# Patient Record
Sex: Female | Born: 1955 | Race: White | Hispanic: Yes | Marital: Married | Smoking: Never smoker
Health system: Southern US, Community
[De-identification: ages and names within clinical notes are randomized; demographics above are authoritative.]

---

## 2007-07-06 ENCOUNTER — Emergency Department (HOSPITAL_COMMUNITY): Admission: EM | Admit: 2007-07-06 | Discharge: 2007-07-06 | Payer: Self-pay | Admitting: Emergency Medicine

## 2007-08-08 ENCOUNTER — Ambulatory Visit (HOSPITAL_COMMUNITY): Admission: RE | Admit: 2007-08-08 | Discharge: 2007-08-08 | Payer: Self-pay | Admitting: *Deleted

## 2007-08-08 ENCOUNTER — Encounter (INDEPENDENT_AMBULATORY_CARE_PROVIDER_SITE_OTHER): Payer: Self-pay | Admitting: *Deleted

## 2007-12-21 IMAGING — US US ABDOMEN COMPLETE
1 series · 13 of 25 positions shown · non-contrast
Comparison: None.

CLINICAL DATA: 51-year-old female, right abdominal pain, vomiting.  
 RENAL/URINARY TRACT ULTRASOUND:
TECHNIQUE: Complete ultrasound examination of the urinary tract was performed including evaluation of the kidneys, renal collecting systems, and urinary bladder.

[Series 1: unknown · 0.38mm/px · 13 of 78 slices shown]
[im 1/78]
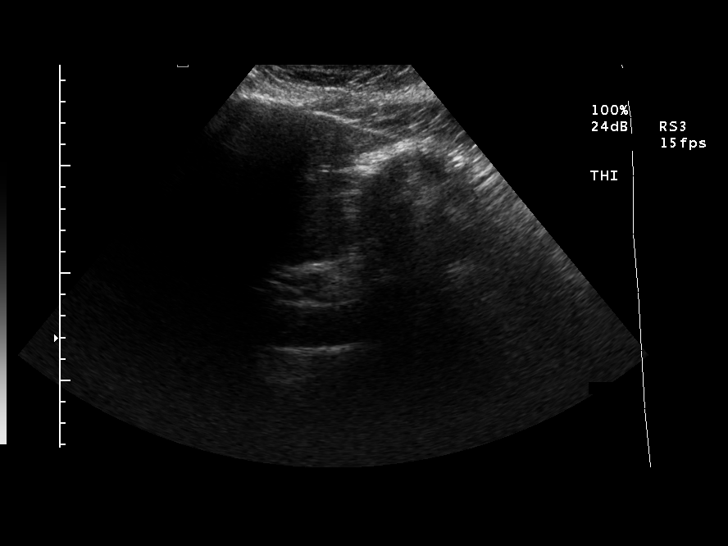
[im 7/78]
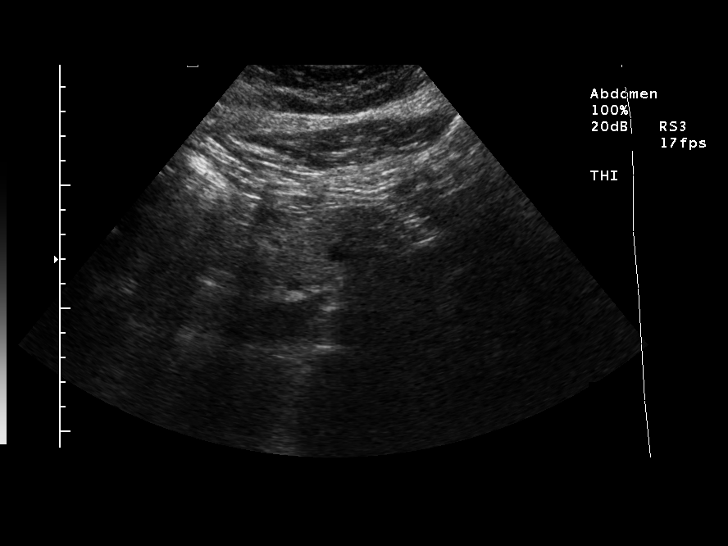
[im 13/78]
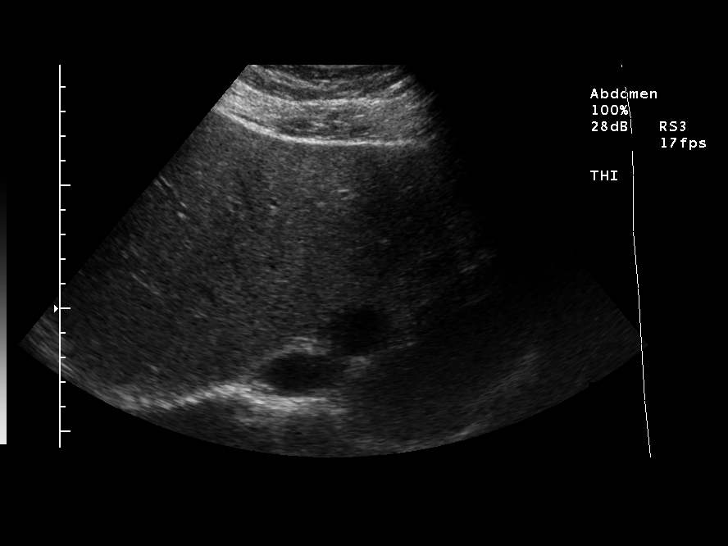
[im 20/78]
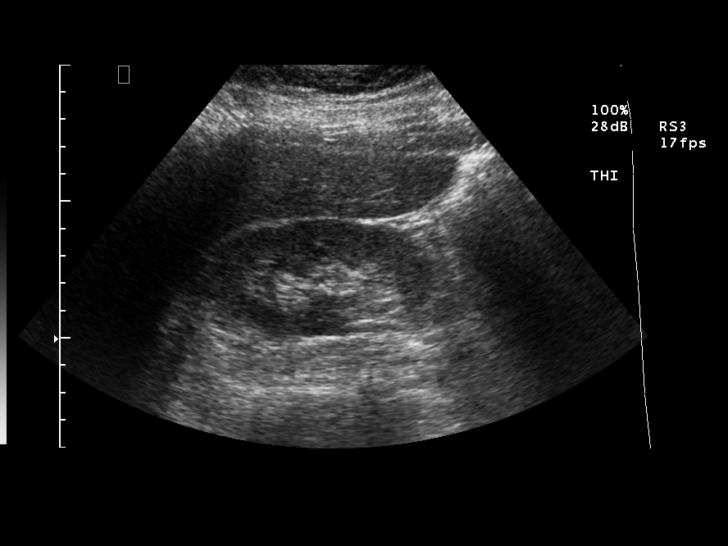
[im 26/78]
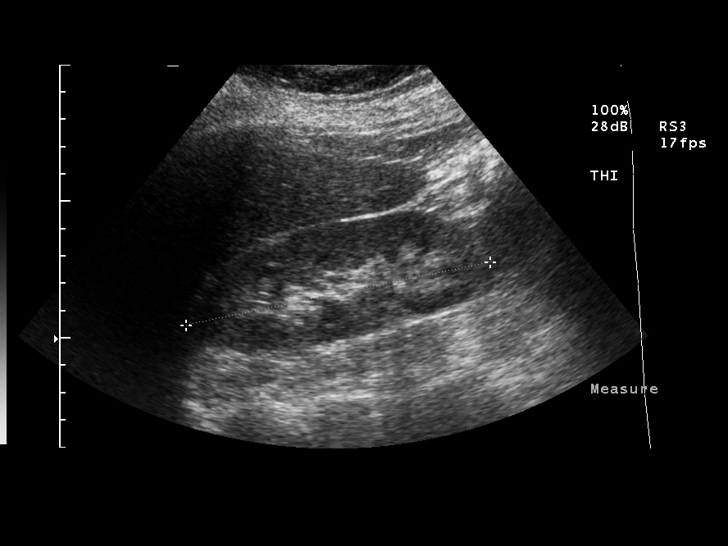
[im 33/78]
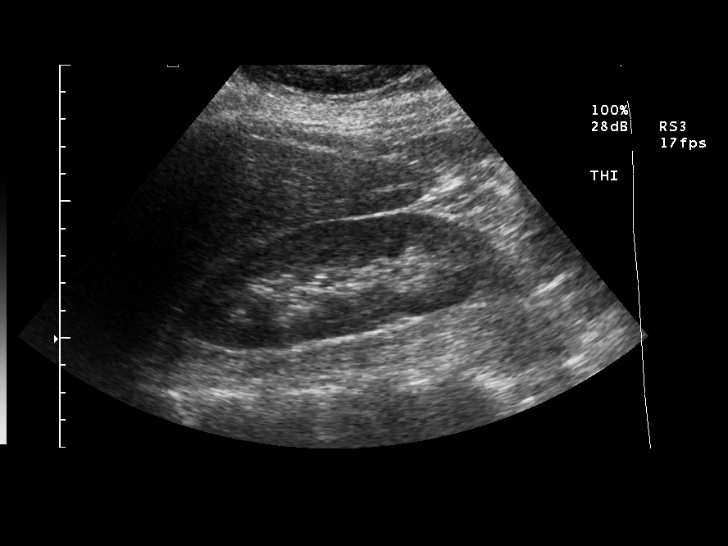
[im 39/78]
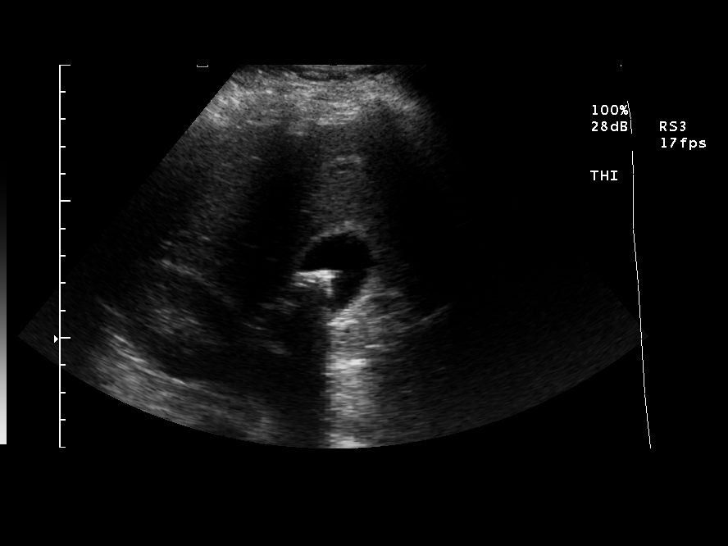
[im 45/78]
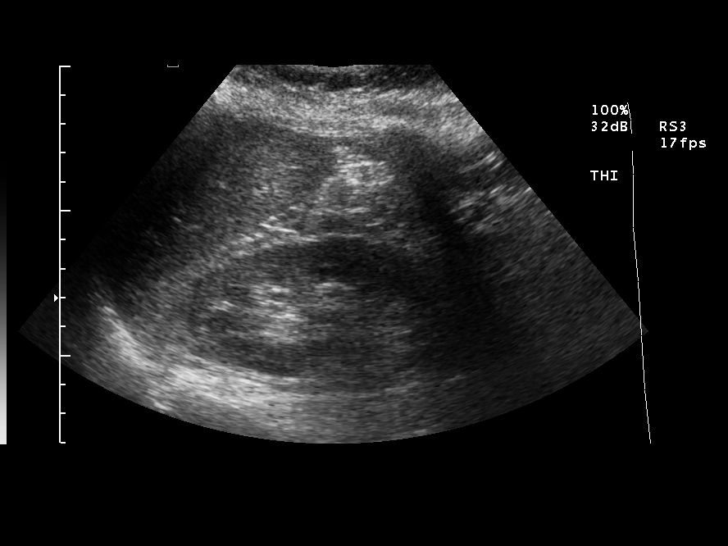
[im 52/78]
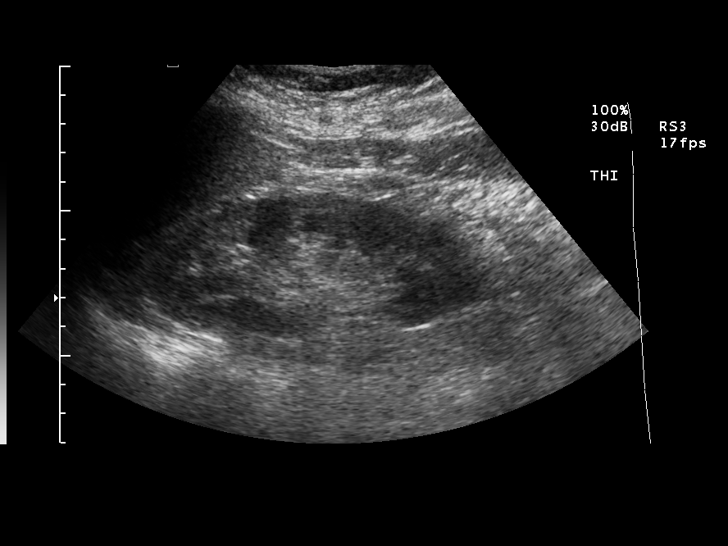
[im 58/78]
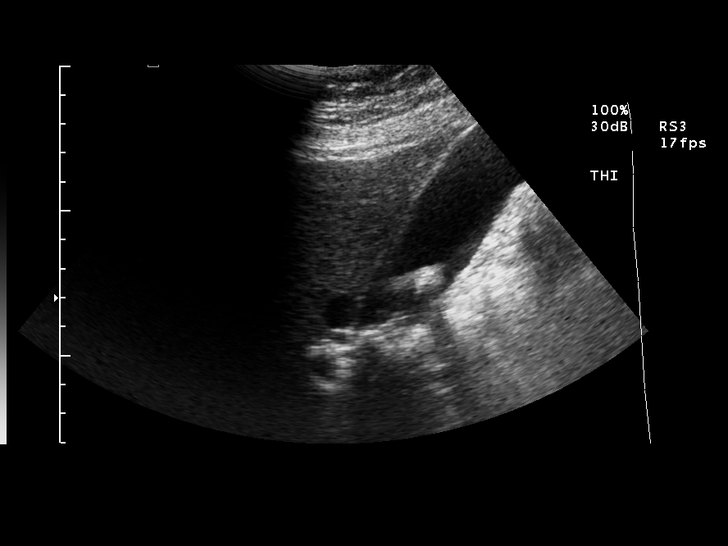
[im 65/78]
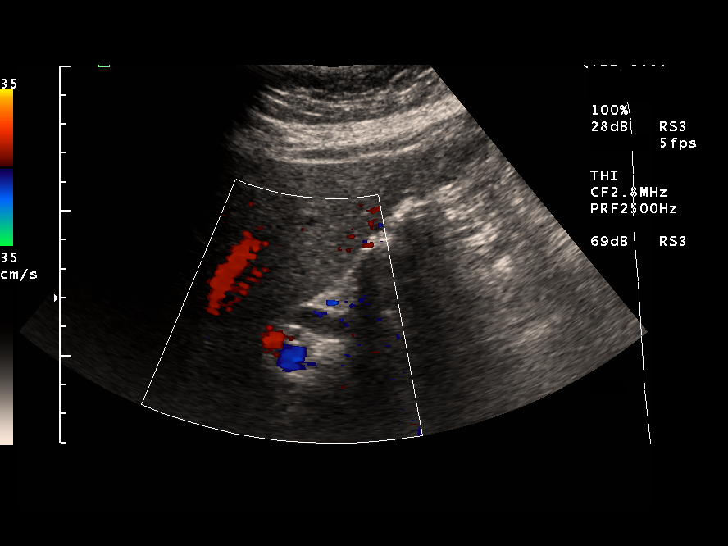
[im 71/78]
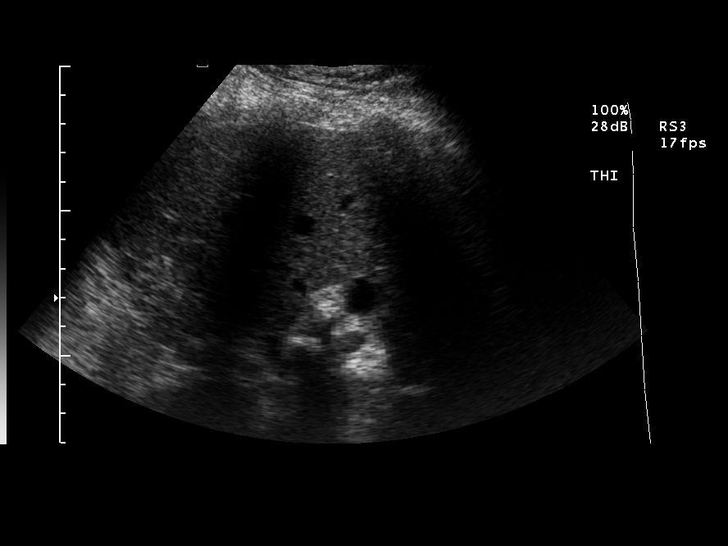
[im 78/78]
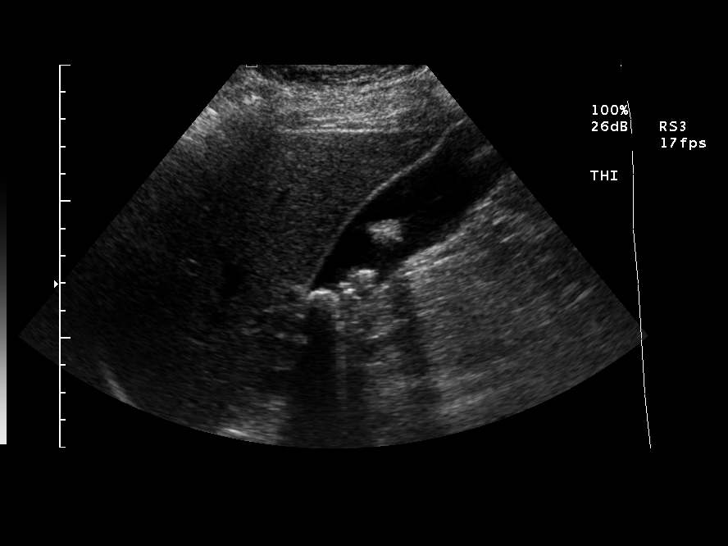

[13 of 25 positions shown; findings below may reference images not displayed]

FINDINGS: Gallbladder contains multiple echogenic shadowing stones.  Wall is prominent but only measures 2.6 mm.  The abdomen is diffusely tender during the exam therefore a true Murphy?s sign cannot be confirmed.  Common bile duct is mildly dilated to 8.4 mm.  The distal duct in the pancreatic head region is not well visualized to exclude choledocholithiasis.  Liver demonstrates no intrahepatic biliary dilatation or definite focal abnormality by ultrasound.  Imaged portions of the IVC, spleen, kidneys, and aorta are within normal limits.  The pancreatic tail could not be visualized.   No ascites or aneurysm.   No hydronephrosis or perinephric fluid collection.
IMPRESSION: 1.  Cholelithiasis. 
 2.  8.4 mm mildly dilated common bile duct without intrahepatic dilatation.   Distal CBD choledocholithiasis not entirely excluded.  Recommend correlation with exam. 
 3.  Difficult visualization of the pancreas.

## 2008-01-23 IMAGING — RF DG CHOLANGIOGRAM OPERATIVE
1 series · 4 of 4 positions shown · non-contrast
Comparison: none

CLINICAL DATA: Cholelithiasis.  Cholecystectomy.
 OPERATIVE CHOLANGIOGRAM ? 08/08/07:
TECHNIQUE: Multiple fluoroscopic radiographs were obtained during intraoperative cholangiogram and are submitted for interpretation post-operatively.

[Series 1: run · 4 of 174 frames shown]
[frame 27/174]
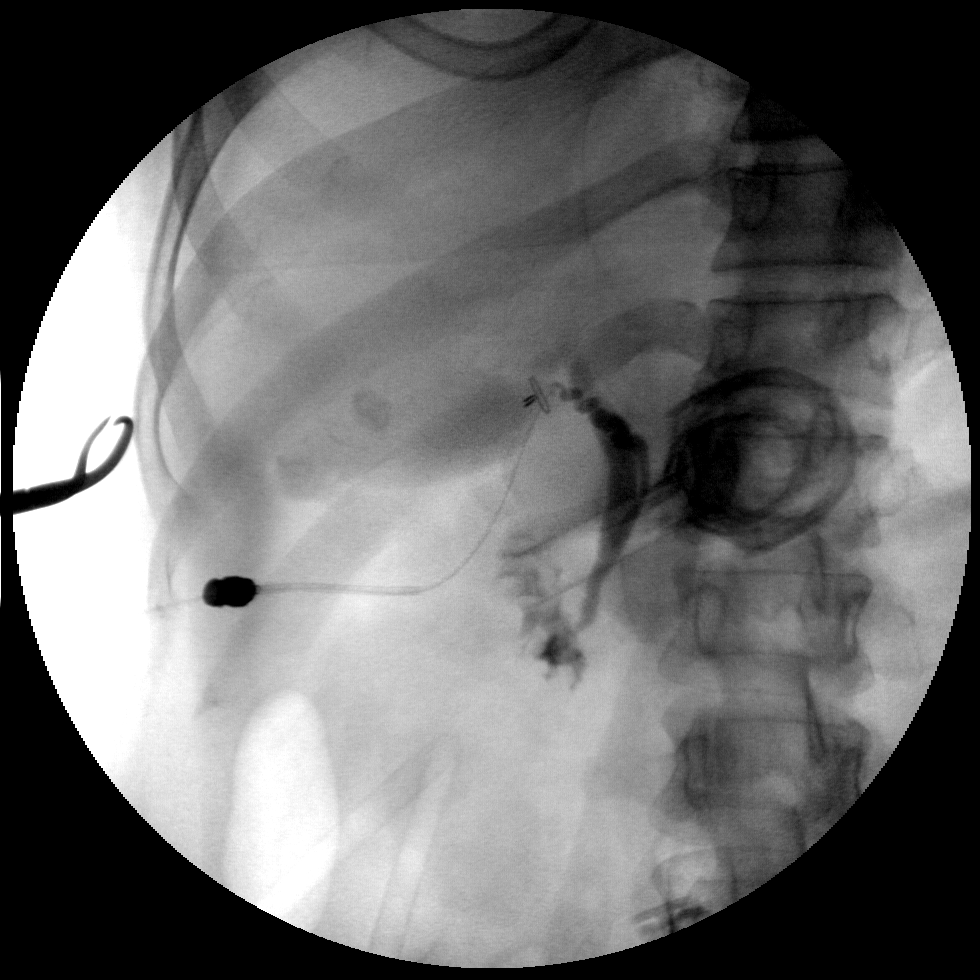
[frame 60/174]
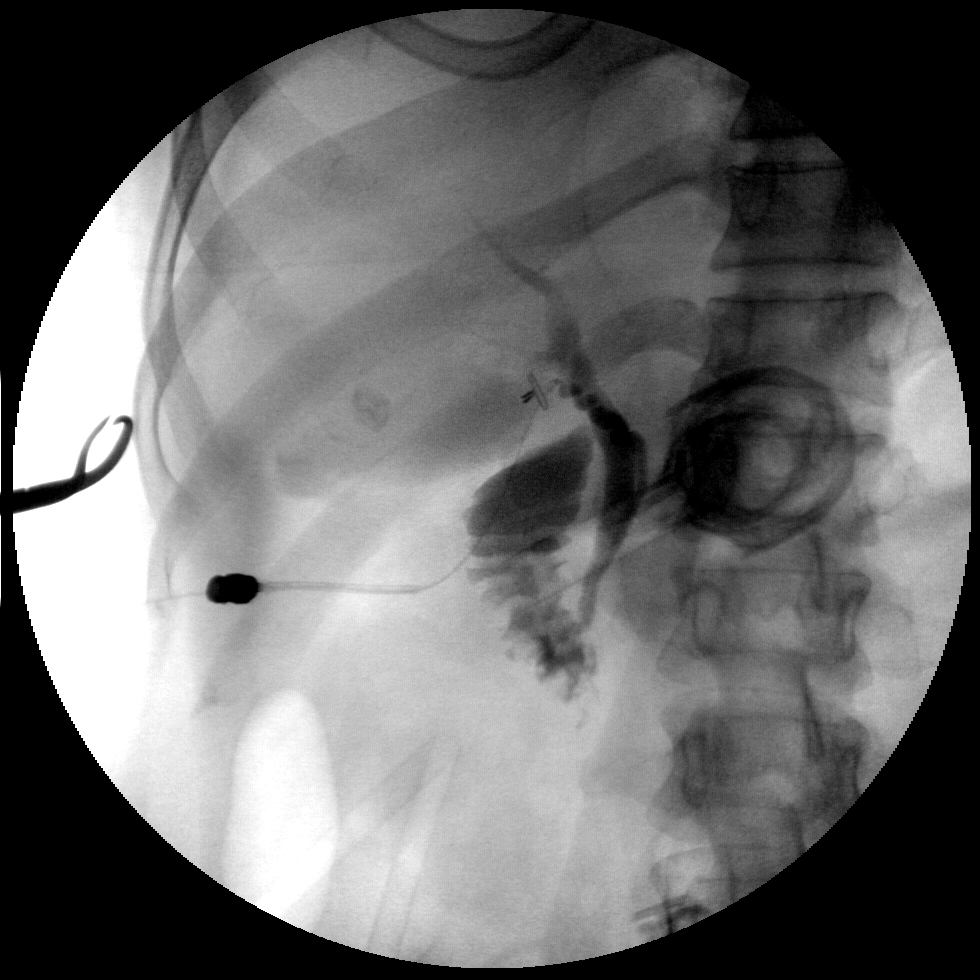
[frame 88/174]
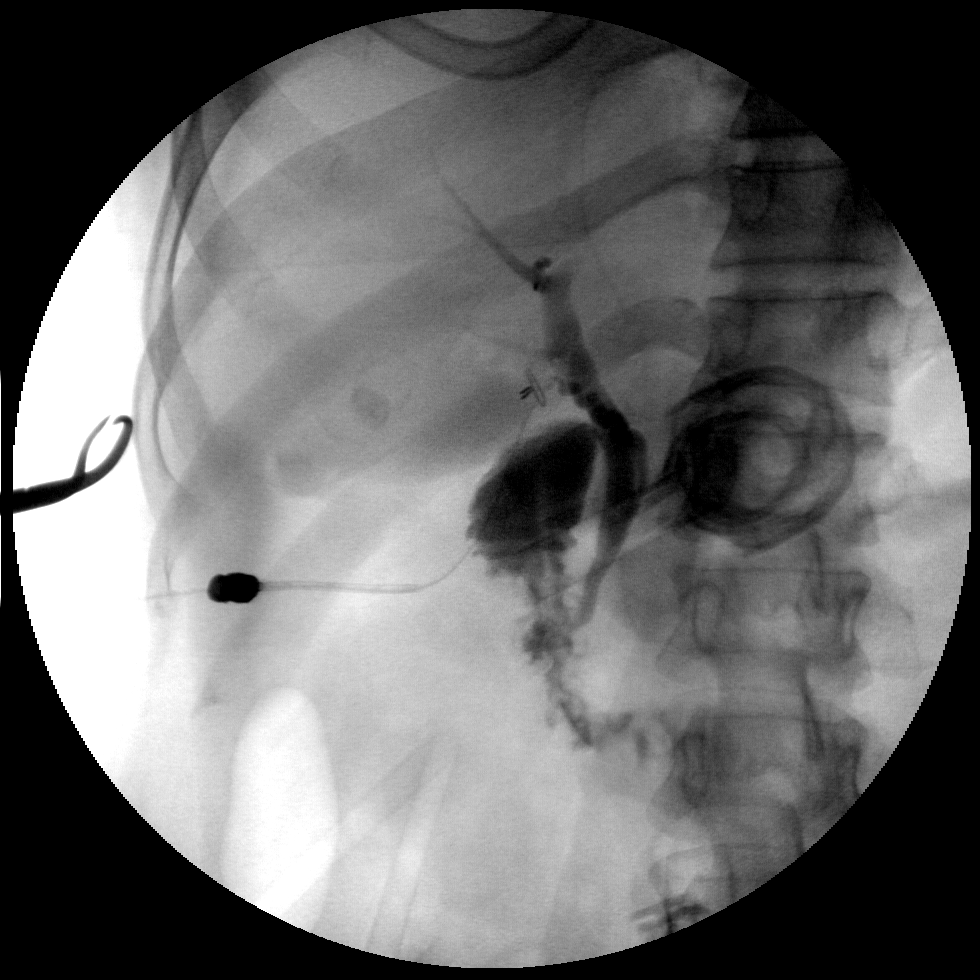
[frame 148/174]
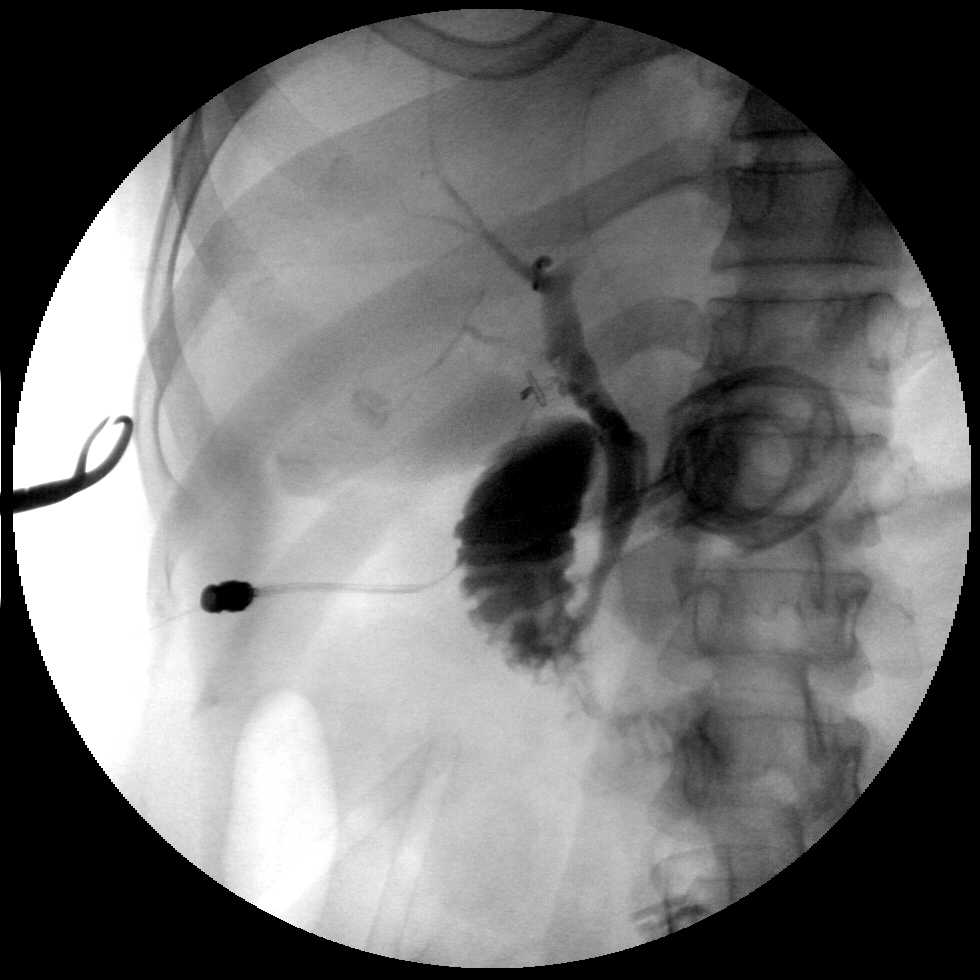

[4 of 4 positions shown; findings below may reference images not displayed]

FINDINGS: A series of fluoroscopic intraoperative spot views from a cholangiogram is provided.  The cystic duct is cannulated.  Opacification of the cystic duct, common bile duct, and proximal intrahepatic ducts demonstrates no filling defect to suggest a retained stone.  No ductal dilatation is seen.  Contrast material flows into the duodenum.  No leakage is identified.
IMPRESSION: Normal intraoperative cholangiogram.

## 2011-03-27 NOTE — Op Note (Signed)
NAMETERRILEE, Miranda Gill                ACCOUNT NO.:  [id number]   MEDICAL RECORD NO.:  [id number]          PATIENT TYPE:  AMB   LOCATION:  DAY                          FACILITY:  [facility]   PHYSICIAN:  Alfonse Ras, MD   DATE OF BIRTH:  [birth year]   DATE OF PROCEDURE:  08/08/2007  DATE OF DISCHARGE:                               OPERATIVE REPORT   PREOPERATIVE DIAGNOSIS:  Symptomatic cholelithiasis and dilated common  bile duct.   POSTOPERATIVE DIAGNOSIS:  Symptomatic cholelithiasis and dilated common  bile duct, normal cholangiogram.   PROCEDURE:  Laparoscopic cholecystectomy with intraoperative  cholangiogram.   SURGEON:  Alfonse Ras, M.D.   ASSISTANT:  Leonie Man, M.D.   ANESTHESIA:  General.   DESCRIPTION OF PROCEDURE:  The patient was taken to the operating room,  placed in supine position.  After adequate general anesthesia was  induced using endotracheal tube, the abdomen was prepped and draped in  the normal sterile fashion.  Using a small vertical supraumbilical  incision, I dissected down to fascia.  Fascia was opened vertically.  An  0 Vicryl pursestring suture was placed on the fascial defect and Hasson  trocar was placed in the abdomen.  Pneumoperitoneum was obtained.  Under  direct vision, a 10-mm trocar was placed in subxiphoid region and two 5-  mm trocars were placed in the right abdomen.  The gallbladder was  identified and retracted cephalad.  There were some dense adhesions to  the gallbladder, which were taken down.  The cystic duct was identified,  but was densely adhered to the cystic artery.  After critical view was  obtained, they were clipped together proximally and a small ductotomy  was made.  Reddick catheter was advanced down the cystic duct and  cholangiogram was performed.  This showed normal diameter common bile  duct and good flow into the right and left hepatic ducts.  The catheter  was then removed and the distal duct and artery were  triply clipped and  divided.  The gallbladder was then taken off the gallbladder bed using  Bovie electrocautery without difficulties.  There was a small posterior  branch, which was clipped as well.  The gallbladder was then placed in  the EndoCatch bag and on attempting to deliver it, however, we did see a  separate about 1 cm sort of calcified mass in the right upper quadrant,  which we mobilized, placed in a separate bag, excised, and sent for  pathologic evaluation.  The gallbladder was then removed, sent for  pathologic evaluation.  Adequate hemostasis was ensured.  The right  upper quadrant was copiously irrigated.  Pneumoperitoneum was released.  The supraumbilical fascial defect was closed with 0 Vicryl pursestring  suture.  Skin and the incisions were closed with subcuticular 4-0  Monocryl.  Steri-Strips and sterile dressings were applied.  The patient  tolerated the procedure well and went to PACU in good condition.     Alfonse Ras, MD  Electronically Signed    KRE/MEDQ  D:  08/08/2007  T:  08/08/2007  Job:  [id number]

## 2011-08-23 LAB — CBC
MCHC: 34.8
MCV: 85.1
Platelets: 350
RBC: 4.5

## 2011-08-23 LAB — DIFFERENTIAL
Eosinophils Relative: 1
Lymphocytes Relative: 31
Lymphs Abs: 3
Monocytes Relative: 7
Neutrophils Relative %: 61

## 2011-08-23 LAB — COMPREHENSIVE METABOLIC PANEL
AST: 18
CO2: 28
Calcium: 9.9
Creatinine, Ser: 0.61
GFR calc Af Amer: >60
GFR calc non Af Amer: >60

## 2011-08-24 LAB — URINE MICROSCOPIC-ADD ON

## 2011-08-24 LAB — COMPREHENSIVE METABOLIC PANEL
AST: 19
Albumin: 3.6
BUN: 11
Calcium: 8.8
Creatinine, Ser: 0.54
GFR calc Af Amer: >60
GFR calc non Af Amer: >60

## 2011-08-24 LAB — URINALYSIS, ROUTINE W REFLEX MICROSCOPIC
Leukocytes, UA: NEGATIVE
Nitrite: NEGATIVE
Specific Gravity, Urine: 1.025
pH: 7.5

## 2011-08-24 LAB — CBC
HCT: 34.7 — ABNORMAL LOW
MCHC: 35.1
MCV: 85
Platelets: 340

## 2011-08-24 LAB — DIFFERENTIAL
Basophils Absolute: 0.2 — ABNORMAL HIGH
Eosinophils Relative: 0
Lymphocytes Relative: 11 — ABNORMAL LOW
Lymphs Abs: 1.4
Monocytes Absolute: 0.4
Neutro Abs: 10.7 — ABNORMAL HIGH

## 2011-08-24 LAB — LIPASE, BLOOD: Lipase: 17

## 2022-06-11 ENCOUNTER — Other Ambulatory Visit: Payer: Self-pay

## 2022-06-11 DIAGNOSIS — Z1231 Encounter for screening mammogram for malignant neoplasm of breast: Secondary | ICD-10-CM

## 2022-08-02 ENCOUNTER — Ambulatory Visit: Payer: Self-pay | Admitting: Hematology and Oncology

## 2022-08-02 ENCOUNTER — Ambulatory Visit
Admission: RE | Admit: 2022-08-02 | Discharge: 2022-08-02 | Disposition: A | Discharge status: Home / Self Care | Payer: No Typology Code available for payment source | Source: Ambulatory Visit | Attending: Obstetrics and Gynecology | Admitting: Obstetrics and Gynecology

## 2022-08-02 VITALS — BP 140/90 | Wt 141.8 lb

## 2022-08-02 DIAGNOSIS — Z1231 Encounter for screening mammogram for malignant neoplasm of breast: Secondary | ICD-10-CM

## 2022-08-02 DIAGNOSIS — Z1211 Encounter for screening for malignant neoplasm of colon: Secondary | ICD-10-CM

## 2022-08-02 DIAGNOSIS — Z01419 Encounter for gynecological examination (general) (routine) without abnormal findings: Secondary | ICD-10-CM

## 2022-08-02 NOTE — Progress Notes (Addendum)
Ms. Carlene Bickley is a 66 y.o. No obstetric history on file. female who presents to [facility] clinic today with no complaints .    Pap Smear: Pap smear completed today. She has never had a pap smear before.    Physical exam: Breasts Breasts symmetrical. No skin abnormalities bilateral breasts. No nipple retraction bilateral breasts. No nipple discharge bilateral breasts. No lymphadenopathy. No lumps palpated bilateral breasts.       Pelvic/Bimanual Ext Genitalia No lesions, no swelling and no discharge observed on external genitalia.        Vagina Vagina pink and normal texture. No lesions or discharge observed in vagina.        Cervix Cervix is present. Cervix pink and of normal texture. No discharge observed.    Uterus Uterus is present and palpable. Uterus in normal position and normal size.        Adnexae Bilateral ovaries present and palpable. No tenderness on palpation.         Rectovaginal No rectal exam completed today since patient had no rectal complaints. No skin abnormalities observed on exam.     Smoking History: Patient has never smoked and was referred to quit line.    Patient Navigation: Patient education provided. Access to services provided for patient through [place] interpreter provided. No transportation provided   Colorectal Cancer Screening: Per patient has never had colonoscopy completed No complaints today. FIT test given.   Breast and Cervical Cancer Risk Assessment: Patient does not have family history of breast cancer, known genetic mutations, or radiation treatment to the chest before age 25. Patient does not have history of cervical dysplasia, immunocompromised, or DES exposure in-utero.  Risk Assessment     Risk Scores       08/02/2022   Last edited by: Royston Bake, CMA   5-year risk: 0.7 %   Lifetime risk: 3 %            A: BCCCP exam with pap smear No complaints with benign breast exam.  P: Referred  patient to the [place]  for a screening mammogram. Appointment scheduled 08/02/2022.  Dayton Scrape A, NP 08/02/2022 1:50 PM

## 2022-08-03 LAB — CYTOLOGY - PAP
Comment: NEGATIVE
Diagnosis: NEGATIVE
High risk HPV: NEGATIVE

## 2022-08-06 ENCOUNTER — Telehealth: Payer: Self-pay

## 2022-08-06 ENCOUNTER — Other Ambulatory Visit: Payer: Self-pay | Admitting: Obstetrics and Gynecology

## 2022-08-06 DIAGNOSIS — R928 Other abnormal and inconclusive findings on diagnostic imaging of breast: Secondary | ICD-10-CM

## 2022-08-06 NOTE — Telephone Encounter (Signed)
Called patient via Miranda Gill, [employer] to give pap smear results. Informed patient that pap smear was normal and HPV was negative. Based on this result and her age her next pap smear will be due based on her PCP's recommendation. Patient voiced understanding.

## 2022-08-09 ENCOUNTER — Telehealth: Payer: Self-pay

## 2022-08-09 LAB — FECAL OCCULT BLOOD, IMMUNOCHEMICAL: Fecal Occult Bld: NEGATIVE

## 2022-08-09 LAB — SPECIMEN STATUS REPORT

## 2022-08-09 NOTE — Telephone Encounter (Signed)
Via Julie Sowell, Spanish Interpreter, Patient informed negative FIT test results, verbalized understanding.  

## 2022-08-16 ENCOUNTER — Ambulatory Visit
Admission: RE | Admit: 2022-08-16 | Discharge: 2022-08-16 | Disposition: A | Discharge status: Home / Self Care | Payer: No Typology Code available for payment source | Source: Ambulatory Visit | Attending: Obstetrics and Gynecology | Admitting: Obstetrics and Gynecology

## 2022-08-16 DIAGNOSIS — R928 Other abnormal and inconclusive findings on diagnostic imaging of breast: Secondary | ICD-10-CM

## 2023-09-13 ENCOUNTER — Other Ambulatory Visit: Payer: Self-pay | Admitting: Obstetrics and Gynecology

## 2023-09-13 DIAGNOSIS — Z1231 Encounter for screening mammogram for malignant neoplasm of breast: Secondary | ICD-10-CM

## 2023-11-28 ENCOUNTER — Other Ambulatory Visit: Payer: Self-pay

## 2023-11-28 ENCOUNTER — Ambulatory Visit: Payer: Self-pay | Admitting: *Deleted

## 2023-11-28 ENCOUNTER — Ambulatory Visit
Admission: RE | Admit: 2023-11-28 | Discharge: 2023-11-28 | Disposition: A | Discharge status: Home / Self Care | Payer: No Typology Code available for payment source | Source: Ambulatory Visit | Attending: Obstetrics and Gynecology | Admitting: Obstetrics and Gynecology

## 2023-11-28 VITALS — BP 149/92 | Wt 141.0 lb

## 2023-11-28 DIAGNOSIS — Z1239 Encounter for other screening for malignant neoplasm of breast: Secondary | ICD-10-CM

## 2023-11-28 DIAGNOSIS — Z1231 Encounter for screening mammogram for malignant neoplasm of breast: Secondary | ICD-10-CM

## 2023-11-28 DIAGNOSIS — Z1211 Encounter for screening for malignant neoplasm of colon: Secondary | ICD-10-CM

## 2023-11-28 NOTE — Progress Notes (Addendum)
Miranda Gill is a 68 y.o. female who presents to [facility] clinic today with no complaints.    Pap Smear: Pap smear not completed today. Last Pap smear was 08/02/2022 at [facility] clinic and was normal with negative HPV. Per patient has no history of an abnormal Pap smear. Last Pap smear result is available in Epic.   Physical exam: Breasts Right breast larger than left breast that per patient is normal for her. No skin abnormalities bilateral breasts. No nipple retraction bilateral breasts. No nipple discharge bilateral breasts. No lymphadenopathy. No lumps palpated bilateral breasts. No complaints of pain or tenderness on exam.  MS DIGITAL DIAG TOMO UNI LEFT Result Date: 08/16/2022 CLINICAL DATA:  Possible asymmetry in the outer left breast on a recent screening mammogram without comparisons. EXAM: DIGITAL DIAGNOSTIC UNILATERAL LEFT MAMMOGRAM WITH TOMOSYNTHESIS; ULTRASOUND LEFT BREAST LIMITED TECHNIQUE: Left digital diagnostic mammography and breast tomosynthesis was performed.; Targeted ultrasound examination of the left breast was performed. COMPARISON:  Previous exam(s). ACR Breast Density Category b: There are scattered areas of fibroglandular density. FINDINGS: 3D tomographic and 2D generated spot compression images of the right breast confirm a small, oval, circumscribed mass in the outer left breast adjacent to a superficial normal appearing intramammary lymph node. On physical exam, there is a small, oval, faintly palpable mass in the 3 o'clock position of the left breast, 3 cm from the nipple. Targeted ultrasound is performed, showing a 6 x 5 x 4 mm oval, horizontally oriented, circumscribed hypoechoic mass with low-level internal echoes in the 3 o'clock position of the left breast, 3 cm from the nipple. No internal blood flow was seen with power Doppler. This corresponds to the mammographic. IMPRESSION: 6 mm benign left breast cyst.  No evidence of malignancy. RECOMMENDATION: Bilateral screening  mammogram in 1 year when due. I have discussed the findings and recommendations with the patient. If applicable, a reminder letter will be sent to the patient regarding the next appointment. BI-RADS CATEGORY  2: Benign. Electronically Signed   By: Beckie Salts M.D.   On: 08/16/2022 17:01  MS DIGITAL SCREENING TOMO BILATERAL Result Date: 08/03/2022 CLINICAL DATA:  Screening. EXAM: DIGITAL SCREENING BILATERAL MAMMOGRAM WITH TOMOSYNTHESIS AND CAD TECHNIQUE: Bilateral screening digital craniocaudal and mediolateral oblique mammograms were obtained. Bilateral screening digital breast tomosynthesis was performed. The images were evaluated with computer-aided detection. COMPARISON:  None available. ACR Breast Density Category b: There are scattered areas of fibroglandular density. FINDINGS: In the left breast, a possible asymmetry warrants further evaluation. In the right breast, no findings suspicious for malignancy. IMPRESSION: Further evaluation is suggested for possible asymmetry in the left breast. RECOMMENDATION: Diagnostic mammogram and possibly ultrasound of the left breast. (Code:[id number]) The patient will be contacted regarding the findings, and additional imaging will be scheduled. BI-RADS CATEGORY  0: Incomplete. Need additional imaging evaluation and/or prior mammograms for comparison. Electronically Signed   By: Ted Mcalpine M.D.   On: 08/03/2022 14:34    Pelvic/Bimanual Pap is not indicated today per BCCCP guidelines.   Smoking History: Patient has never smoked.   Patient Navigation: Patient education provided. Access to services provided for patient through [place] program. Spanish interpreter Miranda Gill from [facility] provided. Patient has food insecurities. Patient escorted to the food market at the [employer] for groceries.   Colorectal Cancer Screening: Per patient has never had colonoscopy completed. FIT Test completed 08/07/2022 that was negative. FIT Test given to patient  today to complete. No complaints today.    Breast and Cervical Cancer  Risk Assessment: Patient does not have family history of breast cancer, known genetic mutations, or radiation treatment to the chest before age 48. Patient does not have history of cervical dysplasia, immunocompromised, or DES exposure in-utero.  Risk Scores as of Encounter on 11/28/2023     Miranda Gill           5-year 1.31%   Lifetime 4.99%   This patient is [place] but has no documented birth country, so the [place] model used data from [place] patients to calculate their risk score. Document a birth country in the Demographics activity for a more accurate score.         Last calculated by Miranda Gill, CMA on 11/28/2023 at 10:20 AM        A: BCCCP exam without pap smear No complaints.  P: Referred patient to the [facility] for a screening mammogram on mobile unit. Appointment scheduled Thursday, November 28, 2023 1100.  Priscille Heidelberg, RN 11/28/2023 10:29 AM

## 2023-11-28 NOTE — Patient Instructions (Signed)
Explained breast self awareness with Prentiss Bells. Patient did not need a Pap smear today due to last Pap smear and HPV typing was 08/02/2022. Let her know BCCCP will cover Pap smears and HPV typing every 5 years unless has a history of abnormal Pap smears. Referred patient to the [facility] for a screening mammogram on mobile unit. Appointment scheduled Thursday, November 28, 2023 1100. Patient aware of appointment and will be there. Let patient know the Breast Center will follow up with her within the next couple weeks with results of her mammogram by letter or phone. Sullivan Backhus verbalized understanding.  Kenlee Vogt, Kathaleen Maser, RN 10:29 AM

## 2023-12-08 LAB — SPECIMEN STATUS REPORT

## 2023-12-08 LAB — FECAL OCCULT BLOOD, IMMUNOCHEMICAL: Fecal Occult Bld: NEGATIVE

## 2024-11-03 ENCOUNTER — Other Ambulatory Visit: Payer: Self-pay | Admitting: Obstetrics and Gynecology

## 2024-11-03 DIAGNOSIS — Z1231 Encounter for screening mammogram for malignant neoplasm of breast: Secondary | ICD-10-CM

## 2024-12-31 ENCOUNTER — Encounter

## 2024-12-31 ENCOUNTER — Ambulatory Visit
